# Patient Record
Sex: Male | Born: 1945 | Race: White | Hispanic: No | Smoking: Never smoker
Health system: Southern US, Community
[De-identification: ages and names within clinical notes are randomized; demographics above are authoritative.]

## PROBLEM LIST (undated history)

## (undated) DIAGNOSIS — F410 Panic disorder [episodic paroxysmal anxiety] without agoraphobia: Secondary | ICD-10-CM

## (undated) DIAGNOSIS — F419 Anxiety disorder, unspecified: Secondary | ICD-10-CM

## (undated) DIAGNOSIS — F32A Depression, unspecified: Secondary | ICD-10-CM

## (undated) DIAGNOSIS — J189 Pneumonia, unspecified organism: Secondary | ICD-10-CM

## (undated) HISTORY — PX: ROTATOR CUFF REPAIR: SHX139

---

## 2019-02-03 HISTORY — PX: BRACHIAL PLEXUS EXPLORATION: SHX1261

## 2019-06-17 ENCOUNTER — Other Ambulatory Visit: Payer: Self-pay | Admitting: *Deleted

## 2019-06-17 DIAGNOSIS — Z20822 Contact with and (suspected) exposure to covid-19: Secondary | ICD-10-CM

## 2019-06-19 LAB — NOVEL CORONAVIRUS, NAA: SARS-CoV-2, NAA: NOT DETECTED

## 2019-06-20 ENCOUNTER — Telehealth: Payer: Self-pay | Admitting: General Practice

## 2019-06-20 NOTE — Telephone Encounter (Signed)
Negative COVID results given. Patient results "NOT Detected." Caller expressed understanding. ° °

## 2019-06-25 ENCOUNTER — Telehealth: Payer: Self-pay

## 2019-06-25 NOTE — Telephone Encounter (Signed)
Patient was returning call stating that he has already received his COVID-19 negative result. Patient was reassured that his test is negative 06/20/2019. No further action was needed.

## 2020-07-21 ENCOUNTER — Other Ambulatory Visit: Payer: Self-pay | Admitting: Neurological Surgery

## 2020-08-05 ENCOUNTER — Other Ambulatory Visit: Payer: Self-pay

## 2020-08-05 ENCOUNTER — Encounter (HOSPITAL_COMMUNITY)
Admission: RE | Admit: 2020-08-05 | Discharge: 2020-08-05 | Disposition: A | Discharge status: Home / Self Care | Payer: No Typology Code available for payment source | Source: Ambulatory Visit | Attending: Neurological Surgery | Admitting: Neurological Surgery

## 2020-08-05 ENCOUNTER — Other Ambulatory Visit (HOSPITAL_COMMUNITY)
Admission: RE | Admit: 2020-08-05 | Discharge: 2020-08-05 | Disposition: A | Discharge status: Home / Self Care | Payer: Medicare HMO | Source: Ambulatory Visit | Attending: Neurological Surgery | Admitting: Neurological Surgery

## 2020-08-05 ENCOUNTER — Encounter (HOSPITAL_COMMUNITY): Payer: Self-pay

## 2020-08-05 ENCOUNTER — Ambulatory Visit (HOSPITAL_COMMUNITY)
Admission: RE | Admit: 2020-08-05 | Discharge: 2020-08-05 | Disposition: A | Discharge status: Home / Self Care | Payer: No Typology Code available for payment source | Source: Ambulatory Visit | Attending: Neurological Surgery | Admitting: Neurological Surgery

## 2020-08-05 DIAGNOSIS — M4310 Spondylolisthesis, site unspecified: Secondary | ICD-10-CM | POA: Insufficient documentation

## 2020-08-05 DIAGNOSIS — Z01812 Encounter for preprocedural laboratory examination: Secondary | ICD-10-CM | POA: Diagnosis present

## 2020-08-05 DIAGNOSIS — Z20822 Contact with and (suspected) exposure to covid-19: Secondary | ICD-10-CM | POA: Insufficient documentation

## 2020-08-05 HISTORY — DX: Pneumonia, unspecified organism: J18.9

## 2020-08-05 HISTORY — DX: Anxiety disorder, unspecified: F41.9

## 2020-08-05 HISTORY — DX: Panic disorder (episodic paroxysmal anxiety): F41.0

## 2020-08-05 HISTORY — DX: Depression, unspecified: F32.A

## 2020-08-05 LAB — PROTIME-INR
INR: 1 (ref 0.8–1.2)
Prothrombin Time: 12.9 seconds (ref 11.4–15.2)

## 2020-08-05 LAB — CBC WITH DIFFERENTIAL/PLATELET
Abs Immature Granulocytes: 0.02 10*3/uL (ref 0.00–0.07)
Basophils Absolute: 0 10*3/uL (ref 0.0–0.1)
Basophils Relative: 0 %
Eosinophils Absolute: 0 10*3/uL (ref 0.0–0.5)
Eosinophils Relative: 1 %
HCT: 44.2 % (ref 39.0–52.0)
Hemoglobin: 14.3 g/dL (ref 13.0–17.0)
Immature Granulocytes: 0 %
Lymphocytes Relative: 28 %
Lymphs Abs: 1.7 10*3/uL (ref 0.7–4.0)
MCH: 31.8 pg (ref 26.0–34.0)
MCHC: 32.4 g/dL (ref 30.0–36.0)
MCV: 98.4 fL (ref 80.0–100.0)
Monocytes Absolute: 0.6 10*3/uL (ref 0.1–1.0)
Monocytes Relative: 10 %
Neutro Abs: 3.9 10*3/uL (ref 1.7–7.7)
Neutrophils Relative %: 61 %
Platelets: 192 10*3/uL (ref 150–400)
RBC: 4.49 MIL/uL (ref 4.22–5.81)
RDW: 11.8 % (ref 11.5–15.5)
WBC: 6.3 10*3/uL (ref 4.0–10.5)
nRBC: 0 % (ref 0.0–0.2)

## 2020-08-05 LAB — BASIC METABOLIC PANEL
Anion gap: 9 (ref 5–15)
BUN: 14 mg/dL (ref 8–23)
CO2: 26 mmol/L (ref 22–32)
Calcium: 9.5 mg/dL (ref 8.9–10.3)
Chloride: 104 mmol/L (ref 98–111)
Creatinine, Ser: 0.94 mg/dL (ref 0.61–1.24)
GFR, Estimated: >60 mL/min (ref >60)
Glucose, Bld: 81 mg/dL (ref 70–99)
Potassium: 4.9 mmol/L (ref 3.5–5.1)
Sodium: 139 mmol/L (ref 135–145)

## 2020-08-05 LAB — TYPE AND SCREEN
ABO/RH(D): O POS
Antibody Screen: NEGATIVE

## 2020-08-05 LAB — SARS CORONAVIRUS 2 (TAT 6-24 HRS): SARS Coronavirus 2: NEGATIVE

## 2020-08-05 LAB — SURGICAL PCR SCREEN
MRSA, PCR: NEGATIVE
Staphylococcus aureus: POSITIVE — AB

## 2020-08-05 NOTE — Progress Notes (Signed)
PCP - Dr. Gerlene Fee @ [facility] Cardiologist -  na   Chest x-ray - 08/05/20 EKG - 08/05/20 Stress Test - yrs. Ago-normal ECHO - na Cardiac Cath - na  Sleep Study - yrs. Ago inconclusive   Blood Thinner Instructions: na Aspirin Instructions: stopped 08/02/20  COVID TEST-  08/05/20   Anesthesia review:   Patient denies shortness of breath, fever, cough and chest pain at PAT appointment   All instructions explained to the patient, with a verbal understanding of the material. Patient agrees to go over the instructions while at home for a better understanding. Patient also instructed to self quarantine after being tested for COVID-19. The opportunity to ask questions was provided.

## 2020-08-05 NOTE — Pre-Procedure Instructions (Signed)
Jason Martinez  08/05/2020      [id number] - [place], [place] - [address] AT [facility] [address] [place] [address] Phone: [phone] Fax: [phone]    Your procedure is scheduled on Dec.6  Report to [facility] Entrance A at 9:15 A.M.  Call this number if you have problems the morning of surgery:  [phone]   Remember:  Do not eat or drink after midnight.      Take these medicines the morning of surgery with A SIP OF WATER :              Tylenol if needed             Clonazepam (klonopin)             Loratadine (claritin)             Omeprazole (prilosec)                      7 days prior to surgery STOP taking any Aspirin (unless otherwise instructed by your surgeon), Aleve, Naproxen, Ibuprofen, Motrin, Advil, Goody's, BC's, all herbal medications, fish oil, and all vitamins.              Follow your surgeon's instructions on when to stop Aspirin.  If no instructions were given by your surgeon then you will need to call the office to get those instructions.      Do not wear jewelry.  Do not wear lotions, powders, or perfumes, or deodorant.  Do not shave 48 hours prior to surgery.  Men may shave face and neck.  Do not bring valuables to the hospital.  [facility] is not responsible for any belongings or valuables.  Contacts, dentures or bridgework may not be worn into surgery.  Leave your suitcase in the car.  After surgery it may be brought to your room.  For patients admitted to the hospital, discharge time will be determined by your treatment team.  Patients discharged the day of surgery will not be allowed to drive home.    Special instructions:  [facility]- Preparing For Surgery  Before surgery, you can play an important role. Because skin is not sterile, your skin needs to be as free of germs as possible. You can reduce the number of germs on your skin by washing with CHG (chlorahexidine gluconate) Soap before surgery.  CHG is  an antiseptic cleaner which kills germs and bonds with the skin to continue killing germs even after washing.    Oral Hygiene is also important to reduce your risk of infection.  Remember - BRUSH YOUR TEETH THE MORNING OF SURGERY WITH YOUR REGULAR TOOTHPASTE  Please do not use if you have an allergy to CHG or antibacterial soaps. If your skin becomes reddened/irritated stop using the CHG.  Do not shave (including legs and underarms) for at least 48 hours prior to first CHG shower. It is OK to shave your face.  Please follow these instructions carefully.   1. Shower the NIGHT BEFORE SURGERY and the MORNING OF SURGERY with CHG.   2. If you chose to wash your hair, wash your hair first as usual with your normal shampoo.  3. After you shampoo, rinse your hair and body thoroughly to remove the shampoo.  4. Use CHG as you would any other liquid soap. You can apply CHG directly to the skin and wash gently with a scrungie or a clean washcloth.  5. Apply the CHG Soap to your body ONLY FROM THE NECK DOWN.  Do not use on open wounds or open sores. Avoid contact with your eyes, ears, mouth and genitals (private parts). Wash Face and genitals (private parts)  with your normal soap.  6. Wash thoroughly, paying special attention to the area where your surgery will be performed.  7. Thoroughly rinse your body with warm water from the neck down.  8. DO NOT shower/wash with your normal soap after using and rinsing off the CHG Soap.  9. Pat yourself dry with a CLEAN TOWEL.  10. Wear CLEAN PAJAMAS to bed the night before surgery, wear comfortable clothes the morning of surgery  11. Place CLEAN SHEETS on your bed the night of your first shower and DO NOT SLEEP WITH PETS.    Day of Surgery:  Do not apply any deodorants/lotions.  Please wear clean clothes to the hospital/surgery center.   Remember to brush your teeth WITH YOUR REGULAR TOOTHPASTE.    Please read over the following fact sheets that  you were given.

## 2020-08-09 ENCOUNTER — Encounter (HOSPITAL_COMMUNITY): Admission: RE | Payer: Self-pay | Source: Home / Self Care

## 2020-08-09 ENCOUNTER — Other Ambulatory Visit: Payer: Self-pay | Admitting: Neurological Surgery

## 2020-08-09 ENCOUNTER — Other Ambulatory Visit (HOSPITAL_COMMUNITY): Payer: Self-pay | Admitting: Neurological Surgery

## 2020-08-09 ENCOUNTER — Ambulatory Visit (HOSPITAL_COMMUNITY)
Admission: RE | Admit: 2020-08-09 | Payer: No Typology Code available for payment source | Source: Home / Self Care | Admitting: Neurological Surgery

## 2020-08-09 DIAGNOSIS — M79602 Pain in left arm: Secondary | ICD-10-CM

## 2020-08-09 SURGERY — POSTERIOR LUMBAR FUSION 1 LEVEL
Anesthesia: General | Site: Back

## 2020-08-19 ENCOUNTER — Other Ambulatory Visit: Payer: Self-pay

## 2020-08-19 ENCOUNTER — Ambulatory Visit (HOSPITAL_COMMUNITY)
Admission: RE | Admit: 2020-08-19 | Discharge: 2020-08-19 | Disposition: A | Discharge status: Home / Self Care | Payer: No Typology Code available for payment source | Source: Ambulatory Visit | Attending: Neurological Surgery | Admitting: Neurological Surgery

## 2020-08-19 DIAGNOSIS — M79602 Pain in left arm: Secondary | ICD-10-CM | POA: Insufficient documentation

## 2020-09-05 ENCOUNTER — Other Ambulatory Visit: Payer: Self-pay

## 2020-09-05 ENCOUNTER — Encounter (HOSPITAL_COMMUNITY): Payer: Self-pay | Admitting: Emergency Medicine

## 2020-09-05 ENCOUNTER — Emergency Department (HOSPITAL_COMMUNITY): Payer: No Typology Code available for payment source

## 2020-09-05 ENCOUNTER — Emergency Department (HOSPITAL_COMMUNITY)
Admission: EM | Admit: 2020-09-05 | Discharge: 2020-09-06 | Disposition: A | Discharge status: Home / Self Care | Payer: No Typology Code available for payment source | Attending: Emergency Medicine | Admitting: Emergency Medicine

## 2020-09-05 DIAGNOSIS — R0602 Shortness of breath: Secondary | ICD-10-CM | POA: Diagnosis not present

## 2020-09-05 DIAGNOSIS — R079 Chest pain, unspecified: Secondary | ICD-10-CM | POA: Diagnosis not present

## 2020-09-05 DIAGNOSIS — Z7982 Long term (current) use of aspirin: Secondary | ICD-10-CM | POA: Insufficient documentation

## 2020-09-05 DIAGNOSIS — Z20822 Contact with and (suspected) exposure to covid-19: Secondary | ICD-10-CM | POA: Diagnosis not present

## 2020-09-05 DIAGNOSIS — J9811 Atelectasis: Secondary | ICD-10-CM | POA: Diagnosis not present

## 2020-09-05 DIAGNOSIS — R0789 Other chest pain: Secondary | ICD-10-CM | POA: Insufficient documentation

## 2020-09-05 LAB — CBC
HCT: 44.2 % (ref 39.0–52.0)
Hemoglobin: 14.9 g/dL (ref 13.0–17.0)
MCH: 31.9 pg (ref 26.0–34.0)
MCHC: 33.7 g/dL (ref 30.0–36.0)
MCV: 94.6 fL (ref 80.0–100.0)
Platelets: 175 10*3/uL (ref 150–400)
RBC: 4.67 MIL/uL (ref 4.22–5.81)
RDW: 11.6 % (ref 11.5–15.5)
WBC: 5.9 10*3/uL (ref 4.0–10.5)
nRBC: 0 % (ref 0.0–0.2)

## 2020-09-05 LAB — TROPONIN I (HIGH SENSITIVITY)
Troponin I (High Sensitivity): 7 ng/L (ref <18)
Troponin I (High Sensitivity): 6 ng/L (ref <18)

## 2020-09-05 LAB — BASIC METABOLIC PANEL
Anion gap: 8 (ref 5–15)
BUN: 10 mg/dL (ref 8–23)
CO2: 25 mmol/L (ref 22–32)
Calcium: 9.4 mg/dL (ref 8.9–10.3)
Chloride: 101 mmol/L (ref 98–111)
Creatinine, Ser: 0.84 mg/dL (ref 0.61–1.24)
GFR, Estimated: >60 mL/min (ref >60)
Glucose, Bld: 107 mg/dL — ABNORMAL HIGH (ref 70–99)
Potassium: 3.9 mmol/L (ref 3.5–5.1)
Sodium: 134 mmol/L — ABNORMAL LOW (ref 135–145)

## 2020-09-05 LAB — RESP PANEL BY RT-PCR (FLU A&B, COVID) ARPGX2
Influenza A by PCR: NEGATIVE
Influenza B by PCR: NEGATIVE
SARS Coronavirus 2 by RT PCR: NEGATIVE

## 2020-09-05 MED ORDER — IOHEXOL 350 MG/ML SOLN
100.0000 mL | Freq: Once | INTRAVENOUS | Status: AC | PRN
  Administered 2020-09-06: 100 mL via INTRAVENOUS

## 2020-09-05 NOTE — ED Triage Notes (Signed)
Patient c/o left chest pain that radiates into left arm pain that started this morning. Patient reports shortness of breath, fatigue, dizziness, and diarrhea. Denies any cardiac hx.
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
## 2020-09-06 DIAGNOSIS — J9811 Atelectasis: Secondary | ICD-10-CM | POA: Diagnosis not present

## 2020-09-06 DIAGNOSIS — R079 Chest pain, unspecified: Secondary | ICD-10-CM | POA: Diagnosis not present

## 2020-09-06 DIAGNOSIS — R0602 Shortness of breath: Secondary | ICD-10-CM | POA: Diagnosis not present

## 2020-09-06 NOTE — ED Provider Notes (Signed)
Care assumed from Dr. Rubin Payor and Langston Masker at shift change.  Patient presented here after an episode of chest pain that started earlier this afternoon.  Patient awaiting results of a CTA of the chest and repeat troponin.  Both of the studies have returned and are negative.  Patient has been reassessed.  He has been pain-free for many hours and appears stable.  At this point, I feel as though discharge is appropriate.  Patient is going to follow-up tomorrow with the [facility] hospital.  To return as needed for any problems.     Geoffery Lyons, MD 09/06/20 [phone]

## 2020-09-06 NOTE — Discharge Instructions (Addendum)
Follow-up with your primary doctor as scheduled, and return to the emergency department if you develop worsening pain, high fever, difficulty breathing, or other new and concerning symptoms.

## 2020-10-20 DIAGNOSIS — T8092XA Unspecified transfusion reaction, initial encounter: Secondary | ICD-10-CM | POA: Diagnosis not present

## 2020-12-30 DIAGNOSIS — S143XXA Injury of brachial plexus, initial encounter: Secondary | ICD-10-CM | POA: Diagnosis not present

## 2020-12-30 DIAGNOSIS — S143XXD Injury of brachial plexus, subsequent encounter: Secondary | ICD-10-CM | POA: Diagnosis not present

## 2021-01-19 ENCOUNTER — Emergency Department (HOSPITAL_COMMUNITY)
Admission: EM | Admit: 2021-01-19 | Discharge: 2021-01-19 | Disposition: A | Discharge status: Home / Self Care | Payer: No Typology Code available for payment source | Attending: Emergency Medicine | Admitting: Emergency Medicine

## 2021-01-19 ENCOUNTER — Encounter (HOSPITAL_COMMUNITY): Payer: Self-pay

## 2021-01-19 ENCOUNTER — Other Ambulatory Visit: Payer: Self-pay

## 2021-01-19 DIAGNOSIS — R109 Unspecified abdominal pain: Secondary | ICD-10-CM | POA: Diagnosis not present

## 2021-01-19 DIAGNOSIS — Z5321 Procedure and treatment not carried out due to patient leaving prior to being seen by health care provider: Secondary | ICD-10-CM | POA: Insufficient documentation

## 2021-01-19 DIAGNOSIS — R1084 Generalized abdominal pain: Secondary | ICD-10-CM | POA: Diagnosis not present

## 2021-01-19 DIAGNOSIS — R110 Nausea: Secondary | ICD-10-CM | POA: Insufficient documentation

## 2021-01-19 DIAGNOSIS — R1013 Epigastric pain: Secondary | ICD-10-CM

## 2021-01-19 LAB — CBC
HCT: 46.6 % (ref 39.0–52.0)
Hemoglobin: 15.2 g/dL (ref 13.0–17.0)
MCH: 30.8 pg (ref 26.0–34.0)
MCHC: 32.6 g/dL (ref 30.0–36.0)
MCV: 94.3 fL (ref 80.0–100.0)
Platelets: 172 10*3/uL (ref 150–400)
RBC: 4.94 MIL/uL (ref 4.22–5.81)
RDW: 13.2 % (ref 11.5–15.5)
WBC: 9.5 10*3/uL (ref 4.0–10.5)
nRBC: 0 % (ref 0.0–0.2)

## 2021-01-19 LAB — COMPREHENSIVE METABOLIC PANEL
ALT: 29 U/L (ref 0–44)
AST: 35 U/L (ref 15–41)
Albumin: 4.4 g/dL (ref 3.5–5.0)
Alkaline Phosphatase: 62 U/L (ref 38–126)
Anion gap: 11 (ref 5–15)
BUN: 16 mg/dL (ref 8–23)
CO2: 27 mmol/L (ref 22–32)
Calcium: 9.3 mg/dL (ref 8.9–10.3)
Chloride: 96 mmol/L — ABNORMAL LOW (ref 98–111)
Creatinine, Ser: 0.82 mg/dL (ref 0.61–1.24)
GFR, Estimated: >60 mL/min (ref >60)
Glucose, Bld: 132 mg/dL — ABNORMAL HIGH (ref 70–99)
Potassium: 4.2 mmol/L (ref 3.5–5.1)
Sodium: 134 mmol/L — ABNORMAL LOW (ref 135–145)
Total Bilirubin: 0.8 mg/dL (ref 0.3–1.2)
Total Protein: 7.1 g/dL (ref 6.5–8.1)

## 2021-01-19 LAB — LIPASE, BLOOD: Lipase: 23 U/L (ref 11–51)

## 2021-01-19 NOTE — ED Triage Notes (Signed)
Pt arrived via ems with c/o abd pain in center of abd and nausea x 3 hours.  LBM was 1 1/2 hours ago.  Denies diarrhea, denies any urinary symptoms.  Pt reports drinks a few beers daily.  Last etoh intake was yesterday.

## 2021-01-19 NOTE — ED Provider Notes (Signed)
Emergency Medicine Provider Triage Evaluation Note  Jason Martinez , a 75 y.o. male  was evaluated in triage.  Pt complains of abdominal pain.  Review of Systems  Positive: Nausea Negative: No vomiting, no diarrhea, no difficulty walking  Physical Exam  BP 125/84 (BP Location: Right Arm)   Pulse 68   Temp 98.3 F (36.8 C) (Oral)   Resp 18   Ht 5\' 9"  (1.753 m)   Wt 79.4 kg   SpO2 100%   BMI 25.84 kg/m  Gen:   Awake, no distress   Resp:  Normal effort  MSK:   Moves extremities without difficulty  Other:  Mild epigastric tenderness  Medical Decision Making  Medically screening exam initiated at 3:08 PM.  Appropriate orders placed.  Jason Martinez was informed that the remainder of the evaluation will be completed by another provider, this initial triage assessment does not replace that evaluation, and the importance of remaining in the ED until their evaluation is complete.  Laboratory testing ordered to evaluate for pancreatitis and metabolic disorders.  Vital signs are reassuring.  No acute interventions required from the screening process.   Jason Jhair, MD 01/21/21 1020

## 2021-01-29 ENCOUNTER — Other Ambulatory Visit: Payer: Self-pay

## 2021-01-29 ENCOUNTER — Encounter: Payer: Self-pay | Admitting: Emergency Medicine

## 2021-01-29 ENCOUNTER — Ambulatory Visit
Admission: EM | Admit: 2021-01-29 | Discharge: 2021-01-29 | Disposition: A | Discharge status: Home / Self Care | Payer: No Typology Code available for payment source | Attending: Family Medicine | Admitting: Family Medicine

## 2021-01-29 DIAGNOSIS — R1013 Epigastric pain: Secondary | ICD-10-CM

## 2021-01-29 DIAGNOSIS — K5900 Constipation, unspecified: Secondary | ICD-10-CM

## 2021-01-29 DIAGNOSIS — K219 Gastro-esophageal reflux disease without esophagitis: Secondary | ICD-10-CM

## 2021-01-29 DIAGNOSIS — K921 Melena: Secondary | ICD-10-CM | POA: Diagnosis not present
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
## 2021-01-29 NOTE — Discharge Instructions (Addendum)
May take 1 capful of MiraLAX with warm liquid daily as needed for constipation  May take simethicone with meals and before bedtime  May try following a low inflammation diet for the next 2 weeks  We have drawn some labs, I will call you in the morning if there is anything abnormal that needs further treatment  Continue omeprazole twice a day  Follow up with this office or with primary care if symptoms are persisting.  Follow up in the ER for high fever, trouble swallowing, trouble breathing, other concerning symptoms.

## 2021-01-29 NOTE — ED Triage Notes (Signed)
Mid abd pain, was seen in the ED recently for same.  Burping and gassy.  Had a black bowel movement today.  States he has no energy.  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

## 2021-01-30 LAB — COMPREHENSIVE METABOLIC PANEL
ALT: 23 IU/L (ref 0–44)
AST: 25 IU/L (ref 0–40)
Albumin/Globulin Ratio: 2 (ref 1.2–2.2)
Albumin: 4.7 g/dL (ref 3.7–4.7)
Alkaline Phosphatase: 72 IU/L (ref 44–121)
BUN/Creatinine Ratio: 8 — ABNORMAL LOW (ref 10–24)
BUN: 7 mg/dL — ABNORMAL LOW (ref 8–27)
Bilirubin Total: 0.5 mg/dL (ref 0.0–1.2)
CO2: 21 mmol/L (ref 20–29)
Calcium: 9.3 mg/dL (ref 8.6–10.2)
Chloride: 97 mmol/L (ref 96–106)
Creatinine, Ser: 0.93 mg/dL (ref 0.76–1.27)
Globulin, Total: 2.4 g/dL (ref 1.5–4.5)
Glucose: 95 mg/dL (ref 65–99)
Potassium: 4.7 mmol/L (ref 3.5–5.2)
Sodium: 135 mmol/L (ref 134–144)
Total Protein: 7.1 g/dL (ref 6.0–8.5)
eGFR: 87 mL/min/{1.73_m2} (ref >59)

## 2021-01-30 LAB — CBC WITH DIFFERENTIAL/PLATELET
Basophils Absolute: 0 10*3/uL (ref 0.0–0.2)
Basos: 1 %
EOS (ABSOLUTE): 0 10*3/uL (ref 0.0–0.4)
Eos: 0 %
Hematocrit: 46.3 % (ref 37.5–51.0)
Hemoglobin: 15.4 g/dL (ref 13.0–17.7)
Immature Grans (Abs): 0 10*3/uL (ref 0.0–0.1)
Immature Granulocytes: 0 %
Lymphocytes Absolute: 1.8 10*3/uL (ref 0.7–3.1)
Lymphs: 30 %
MCH: 31 pg (ref 26.6–33.0)
MCHC: 33.3 g/dL (ref 31.5–35.7)
MCV: 93 fL (ref 79–97)
Monocytes Absolute: 0.5 10*3/uL (ref 0.1–0.9)
Monocytes: 9 %
Neutrophils Absolute: 3.7 10*3/uL (ref 1.4–7.0)
Neutrophils: 60 %
Platelets: 178 10*3/uL (ref 150–450)
RBC: 4.97 x10E6/uL (ref 4.14–5.80)
RDW: 14 % (ref 11.6–15.4)
WBC: 6.1 10*3/uL (ref 3.4–10.8)

## 2021-01-30 LAB — LIPASE: Lipase: 18 U/L (ref 13–78)

## 2021-03-31 DIAGNOSIS — S143XXD Injury of brachial plexus, subsequent encounter: Secondary | ICD-10-CM | POA: Diagnosis not present

## 2021-08-11 DIAGNOSIS — R944 Abnormal results of kidney function studies: Secondary | ICD-10-CM | POA: Diagnosis not present

## 2021-08-11 DIAGNOSIS — G4700 Insomnia, unspecified: Secondary | ICD-10-CM | POA: Diagnosis not present

## 2021-09-08 ENCOUNTER — Other Ambulatory Visit: Payer: Self-pay | Admitting: Physician Assistant

## 2021-09-08 ENCOUNTER — Other Ambulatory Visit (HOSPITAL_COMMUNITY): Payer: Self-pay | Admitting: Physician Assistant

## 2021-09-08 DIAGNOSIS — M542 Cervicalgia: Secondary | ICD-10-CM

## 2021-09-21 ENCOUNTER — Ambulatory Visit (HOSPITAL_COMMUNITY)
Admission: RE | Admit: 2021-09-21 | Discharge: 2021-09-21 | Disposition: A | Discharge status: Home / Self Care | Payer: No Typology Code available for payment source | Source: Ambulatory Visit | Attending: Physician Assistant | Admitting: Physician Assistant

## 2021-09-21 ENCOUNTER — Other Ambulatory Visit: Payer: Self-pay

## 2021-09-21 DIAGNOSIS — M542 Cervicalgia: Secondary | ICD-10-CM | POA: Insufficient documentation

## 2021-10-05 ENCOUNTER — Encounter: Payer: Self-pay | Admitting: Physical Medicine and Rehabilitation

## 2021-10-05 DEATH — deceased

## 2021-12-21 ENCOUNTER — Other Ambulatory Visit (HOSPITAL_COMMUNITY): Payer: Self-pay | Admitting: Physician Assistant

## 2021-12-21 ENCOUNTER — Other Ambulatory Visit: Payer: Self-pay | Admitting: Physician Assistant

## 2021-12-21 DIAGNOSIS — G8929 Other chronic pain: Secondary | ICD-10-CM

## 2021-12-21 DIAGNOSIS — M25512 Pain in left shoulder: Secondary | ICD-10-CM

## 2021-12-27 ENCOUNTER — Ambulatory Visit: Admission: EM | Admit: 2021-12-27 | Discharge: 2021-12-27 | Discharge status: Absent without leave | Payer: Medicare PPO

## 2021-12-28 ENCOUNTER — Ambulatory Visit
Admission: EM | Admit: 2021-12-28 | Discharge: 2021-12-28 | Disposition: A | Discharge status: Home / Self Care | Payer: No Typology Code available for payment source | Attending: Family Medicine | Admitting: Family Medicine

## 2021-12-28 ENCOUNTER — Ambulatory Visit (INDEPENDENT_AMBULATORY_CARE_PROVIDER_SITE_OTHER): Payer: Medicare PPO

## 2021-12-28 VITALS — BP 111/75 | HR 82 | Temp 98.2°F | Resp 18

## 2021-12-28 DIAGNOSIS — S42215A Unspecified nondisplaced fracture of surgical neck of left humerus, initial encounter for closed fracture: Secondary | ICD-10-CM

## 2021-12-28 DIAGNOSIS — S42295A Other nondisplaced fracture of upper end of left humerus, initial encounter for closed fracture: Secondary | ICD-10-CM

## 2021-12-28 DIAGNOSIS — W19XXXA Unspecified fall, initial encounter: Secondary | ICD-10-CM

## 2021-12-28 DIAGNOSIS — M25522 Pain in left elbow: Secondary | ICD-10-CM
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
## 2022-01-03 ENCOUNTER — Ambulatory Visit (INDEPENDENT_AMBULATORY_CARE_PROVIDER_SITE_OTHER): Payer: Medicare PPO | Admitting: Orthopedic Surgery

## 2022-01-03 ENCOUNTER — Encounter: Payer: Self-pay | Admitting: Orthopedic Surgery

## 2022-01-03 ENCOUNTER — Ambulatory Visit (INDEPENDENT_AMBULATORY_CARE_PROVIDER_SITE_OTHER): Payer: Medicare PPO

## 2022-01-03 VITALS — Ht 69.0 in | Wt 175.0 lb

## 2022-01-03 DIAGNOSIS — S42295A Other nondisplaced fracture of upper end of left humerus, initial encounter for closed fracture: Secondary | ICD-10-CM | POA: Diagnosis not present

## 2022-01-03 DIAGNOSIS — M25512 Pain in left shoulder: Secondary | ICD-10-CM

## 2022-01-03 MED ORDER — TRAMADOL HCL 50 MG PO TABS: 50.0000 mg | ORAL_TABLET | Freq: Four times a day (QID) | ORAL | 0 refills | Status: AC | PRN

## 2022-01-04 ENCOUNTER — Encounter: Payer: Self-pay | Admitting: Orthopedic Surgery
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
## 2022-01-09 ENCOUNTER — Encounter
Payer: No Typology Code available for payment source | Attending: Physical Medicine and Rehabilitation | Admitting: Physical Medicine and Rehabilitation

## 2022-01-10 ENCOUNTER — Ambulatory Visit (INDEPENDENT_AMBULATORY_CARE_PROVIDER_SITE_OTHER): Payer: Medicare PPO

## 2022-01-10 ENCOUNTER — Ambulatory Visit (INDEPENDENT_AMBULATORY_CARE_PROVIDER_SITE_OTHER): Payer: Medicare PPO | Admitting: Orthopedic Surgery

## 2022-01-10 ENCOUNTER — Encounter: Payer: Self-pay | Admitting: Orthopedic Surgery

## 2022-01-10 DIAGNOSIS — S42295D Other nondisplaced fracture of upper end of left humerus, subsequent encounter for fracture with routine healing: Secondary | ICD-10-CM

## 2022-01-10 DIAGNOSIS — S42295A Other nondisplaced fracture of upper end of left humerus, initial encounter for closed fracture: Secondary | ICD-10-CM
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
## 2022-01-24 ENCOUNTER — Encounter: Payer: Self-pay | Admitting: Orthopedic Surgery

## 2022-01-24 ENCOUNTER — Ambulatory Visit (INDEPENDENT_AMBULATORY_CARE_PROVIDER_SITE_OTHER): Payer: Medicare PPO | Admitting: Orthopedic Surgery

## 2022-01-24 ENCOUNTER — Ambulatory Visit (INDEPENDENT_AMBULATORY_CARE_PROVIDER_SITE_OTHER): Payer: Medicare PPO

## 2022-01-24 DIAGNOSIS — S42295D Other nondisplaced fracture of upper end of left humerus, subsequent encounter for fracture with routine healing: Secondary | ICD-10-CM

## 2022-01-24 NOTE — Progress Notes (Signed)
Return Patient Visit  Assessment: Jason Martinez is a 76 y.o. RHD male with the following: 1. Other closed nondisplaced fracture of proximal end of left humerus  Plan: [name] sustained a minimally displaced fracture of the left proximal humerus.  Radiographs are stable.  He is healing his fracture.  Medications as needed.  Okay to initiate gentle range of motion.  We will start pendulum exercises for the next 2 weeks, as well as range of motion of the elbow wrist and hand.  In 2 weeks, he can initiate passive range of motion.  Follow-up in a month.  Follow-up: Return in about 4 weeks (around 02/21/2022).  Subjective:  Chief Complaint  Patient presents with   Shoulder Pain    LT shoulder DOI 12/21/21---closed nondisplaced fracture of proximal end of left humerus ---pain is much improved    History of Present Illness: Jason Martinez is a 76 y.o. male who returns for evaluation of a left shoulder injury.  He sustained a left proximal humerus fracture 4-5 weeks ago.  His pain is improving.  He notes more function of the left upper extremity.  He is no longer using the sling.   Review of Systems: No fevers or chills No numbness or tingling No chest pain No shortness of breath No bowel or bladder dysfunction No GI distress No headaches  Objective: There were no vitals taken for this visit.  Physical Exam:  General: Alert and oriented. and No acute distress. Gait: Normal gait.  Minimal bruising is appreciated.  Swelling is improved.  Sensation over the axillary nerve distribution is at baseline.  He does have some atrophy of the first webspace.  Fingers are warm and well-perfused.   IMAGING: I personally ordered and reviewed the following images  X-ray of the left shoulder was obtained in clinic today.  These were compared to prior x-rays.  There is been no interval displacement of the fracture.  Glenohumeral joint remains reduced.  There is some evidence of callus  formation.  Impression: Healing left proximal humerus fracture, in unchanged alignment.   New Medications:  No orders of the defined types were placed in this encounter.     Oliver Barre, MD  01/24/2022 10:00 PM

## 2022-01-24 NOTE — Patient Instructions (Signed)
Pendulum   Stand near a wall or a surface that you can hold onto for balance. Bend at the waist and let your left / right arm hang straight down. Use your other arm to support you. Keep your back straight and do not lock your knees. Relax your left / right arm and shoulder muscles, and move your hips and your trunk so your left / right arm swings freely. Your arm should swing because of the motion of your body, not because you are using your arm or shoulder muscles. Keep moving your hips and trunk so your arm swings in the following directions, as told by your health care provider: Side to side. Forward and backward. In clockwise and counterclockwise circles. Continue each motion for 20 seconds, or for as long as told by your health care provider. Slowly return to the starting position.  Repeat 10 times. Complete this exercise daily.     IN 2 weeks, can start to work on passive range of motion.  You can start to stretch the shoulder using the right hand.   Follow up in 1 month

## 2022-02-21 ENCOUNTER — Ambulatory Visit (INDEPENDENT_AMBULATORY_CARE_PROVIDER_SITE_OTHER): Payer: Medicare PPO

## 2022-02-21 ENCOUNTER — Ambulatory Visit (INDEPENDENT_AMBULATORY_CARE_PROVIDER_SITE_OTHER): Payer: Medicare PPO | Admitting: Orthopedic Surgery

## 2022-02-21 ENCOUNTER — Encounter: Payer: Self-pay | Admitting: Orthopedic Surgery

## 2022-02-21 DIAGNOSIS — F32A Depression, unspecified: Secondary | ICD-10-CM | POA: Insufficient documentation

## 2022-02-21 DIAGNOSIS — S42295D Other nondisplaced fracture of upper end of left humerus, subsequent encounter for fracture with routine healing: Secondary | ICD-10-CM

## 2022-02-22 NOTE — Progress Notes (Signed)
Return Patient Visit  Assessment: Jason Martinez is a 76 y.o. RHD male with the following: 1. Other closed nondisplaced fracture of proximal end of left humerus  Plan: Elvis Laufer is a left proximal humerus fracture, with stable alignment.  He has been out of his sling.  He can start to work more aggressively on range of motion.  Start passive range of motion.  As his motion improves, okay to increase his active motion.  He would prefer to do this at home, and not try formal physical therapy at this point.  If he continues to struggle, we can try formal physical therapy.  He is in agreement with this plan.  Follow-up in 1 month.  Follow-up: Return in about 4 weeks (around 03/21/2022).  Subjective:  Chief Complaint  Patient presents with   Arm Injury    Left humerus fracture     History of Present Illness: [name] is a 76 y.o. male who returns for evaluation of a left shoulder injury.  He sustained a left proximal humerus fracture approximately 2 months ago.  His pain is much better.  He has been limiting the use of the left arm.  He has been doing some gentle range of motion.  No numbness or tingling.  He is not using the sling.   Review of Systems: No fevers or chills No numbness or tingling No chest pain No shortness of breath No bowel or bladder dysfunction No GI distress No headaches  Objective: There were no vitals taken for this visit.  Physical Exam:  General: Alert and oriented. and No acute distress. Gait: Normal gait.  Left arm without swelling.  No bruising.  Tolerates passive forward flexion beyond 90 degrees.  Limited external rotation.  Fingers warm and well.  Sensation is intact throughout the left hand.   IMAGING: I personally ordered and reviewed the following images  X-ray of the left shoulder was obtained in clinic today.  These were compared to prior x-rays.  There has been no interval displacement of the proximal humerus fracture.  The  glenohumeral joint remains reduced.  No acute injuries are noted.  The minimal interval callus formation, but there has been some consolidation.  Impression: Healing left proximal humerus fracture, in unchanged alignment.   New Medications:  No orders of the defined types were placed in this encounter.     Oliver Barre, MD  02/22/2022 8:10 AM

## 2022-03-06 ENCOUNTER — Ambulatory Visit (INDEPENDENT_AMBULATORY_CARE_PROVIDER_SITE_OTHER): Payer: Medicare PPO

## 2022-03-06 ENCOUNTER — Ambulatory Visit
Admission: EM | Admit: 2022-03-06 | Discharge: 2022-03-06 | Disposition: A | Discharge status: Home / Self Care | Payer: No Typology Code available for payment source | Attending: Nurse Practitioner | Admitting: Nurse Practitioner

## 2022-03-06 DIAGNOSIS — R5383 Other fatigue: Secondary | ICD-10-CM | POA: Diagnosis not present

## 2022-03-06 DIAGNOSIS — R071 Chest pain on breathing: Secondary | ICD-10-CM

## 2022-03-06 NOTE — Discharge Instructions (Signed)
-   The chest x-ray did not show any acute infectious process; it did show low lung volumes.  Please try to work on deep breathing exercises. -We have checked the blood counts and thyroid level and will let you know if either these come back abnormal -Follow-up with your primary care provider if the blood work comes back normal for further evaluation of your symptoms
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
## 2022-03-06 NOTE — ED Triage Notes (Signed)
Pt presents with c/o fatigue and feeling unwell  for past few months , last had lab work done at the [place] months ago

## 2022-03-07 LAB — CBC WITH DIFFERENTIAL/PLATELET
Basophils Absolute: 0 10*3/uL (ref 0.0–0.2)
Basos: 0 %
EOS (ABSOLUTE): 0 10*3/uL (ref 0.0–0.4)
Eos: 1 %
Hematocrit: 42.7 % (ref 37.5–51.0)
Hemoglobin: 14.7 g/dL (ref 13.0–17.7)
Immature Grans (Abs): 0 10*3/uL (ref 0.0–0.1)
Immature Granulocytes: 0 %
Lymphocytes Absolute: 1.9 10*3/uL (ref 0.7–3.1)
Lymphs: 32 %
MCH: 32 pg (ref 26.6–33.0)
MCHC: 34.4 g/dL (ref 31.5–35.7)
MCV: 93 fL (ref 79–97)
Monocytes Absolute: 0.6 10*3/uL (ref 0.1–0.9)
Monocytes: 10 %
Neutrophils Absolute: 3.4 10*3/uL (ref 1.4–7.0)
Neutrophils: 57 %
Platelets: 195 10*3/uL (ref 150–450)
RBC: 4.6 x10E6/uL (ref 4.14–5.80)
RDW: 12 % (ref 11.6–15.4)
WBC: 5.9 10*3/uL (ref 3.4–10.8)

## 2022-03-07 LAB — TSH: TSH: 1.15 u[IU]/mL (ref 0.450–4.500)

## 2022-03-24 ENCOUNTER — Encounter: Payer: Self-pay | Admitting: Orthopedic Surgery

## 2022-03-24 ENCOUNTER — Ambulatory Visit (INDEPENDENT_AMBULATORY_CARE_PROVIDER_SITE_OTHER): Payer: Medicare PPO | Admitting: Orthopedic Surgery

## 2022-03-24 DIAGNOSIS — S42295D Other nondisplaced fracture of upper end of left humerus, subsequent encounter for fracture with routine healing: Secondary | ICD-10-CM

## 2022-03-24 NOTE — Progress Notes (Signed)
Return Patient Visit  Assessment: Jason Martinez is a 76 y.o. RHD male with the following: 1. Other closed nondisplaced fracture of proximal end of left humerus  Plan: [name] has recovered following a left proximal humerus fracture, which was sustained approximately 3 months ago.  His current function is at his approximate baseline although he does continue to have some pain in the shoulder.  Urged him to continue working on strengthening to improve his overall function.  Medications as needed.  No follow-up is needed at this time.  Follow-up: Return if symptoms worsen or fail to improve.  Subjective:  Chief Complaint  Patient presents with   Post-op Follow-up    Left humerus fracture DOI 12/21/21    History of Present Illness: Jason Martinez is a 75 y.o. male who returns for evaluation of a left shoulder injury.  He sustained a left proximal humerus fracture approximately 3 months ago.  He does continue to have some pain in the left shoulder.  His current level of function is at his approximate baseline.  He has been doing some exercises for his shoulder, with gradual improvement.   Review of Systems: No fevers or chills No numbness or tingling No chest pain No shortness of breath No bowel or bladder dysfunction No GI distress No headaches  Objective: There were no vitals taken for this visit.  Physical Exam:  General: Alert and oriented. and No acute distress. Gait: Normal gait.  Left arm without swelling.  No bruising.  Tolerates passive forward flexion beyond 90 degrees.  Limited external rotation.  Fingers warm and well.  Sensation is intact throughout the left hand.   IMAGING: I personally ordered and reviewed the following images  No new imaging obtained today.   New Medications:  No orders of the defined types were placed in this encounter.     Oliver Barre, MD  03/24/2022 10:31 PM

## 2022-04-28 ENCOUNTER — Ambulatory Visit: Payer: Medicare PPO | Admitting: Physical Medicine and Rehabilitation

## 2022-05-19 IMAGING — CT CT CERVICAL SPINE W/O CM
3 of 4 series · 11 of 33 positions shown, 12 images · non-contrast
Comparison: None.

CLINICAL DATA: Left arm pain with numbness and tingling

EXAM:
CT CERVICAL SPINE WITHOUT CONTRAST
TECHNIQUE: Multidetector CT imaging of the cervical spine was performed without
intravenous contrast. Multiplanar CT image reconstructions were also
generated.

[Series 4: c spine soft · axial · 0.31mm/px · z∈[+1522,+1644]mm · 3 of 101 slices shown, 4 images]
[im 24/101  soft-tissue]
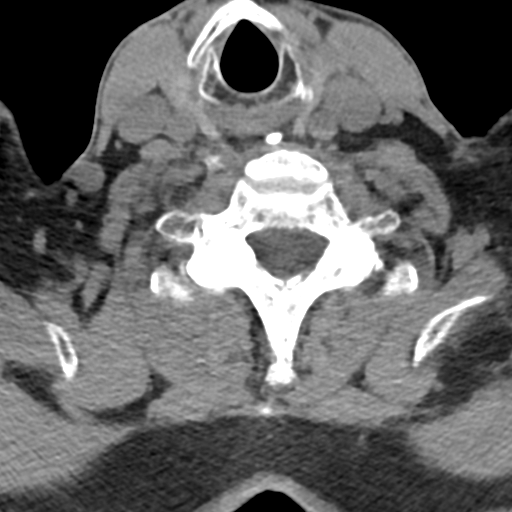
[im 24/101  bone]
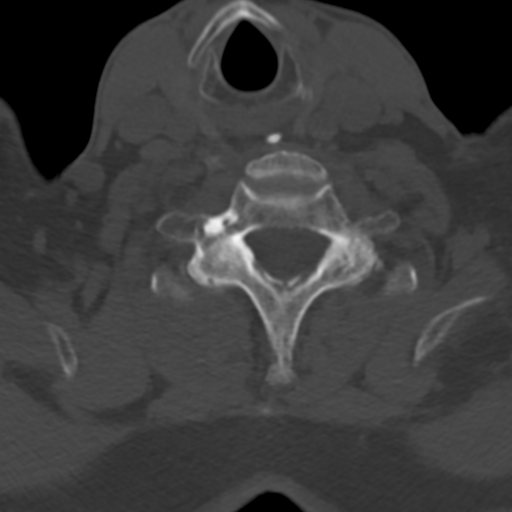
[im 54/101  bone]
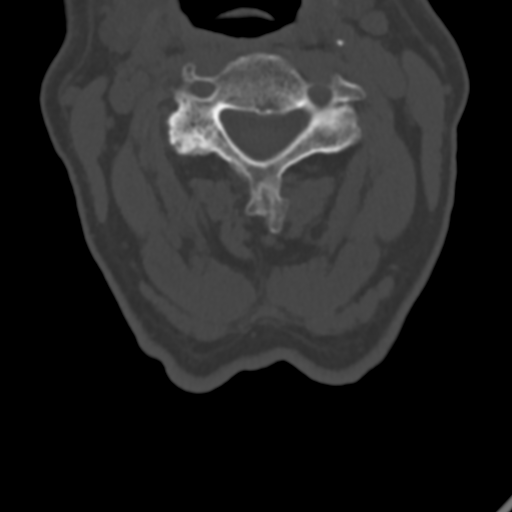
[im 85/101  bone]
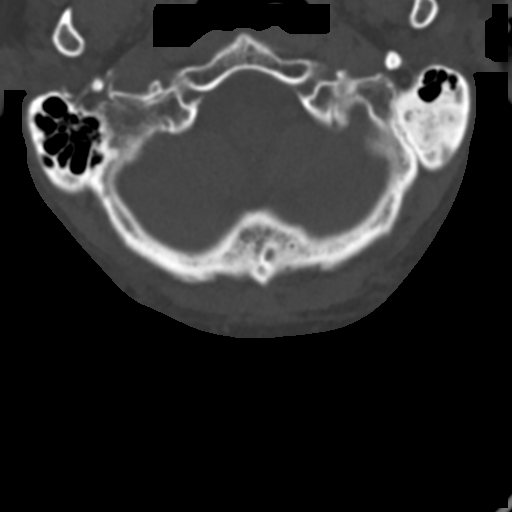

[Series 7: sagittal bone · sagittal · 0.29mm/px · 5 of 61 slices shown]
[im 11/61  bone]
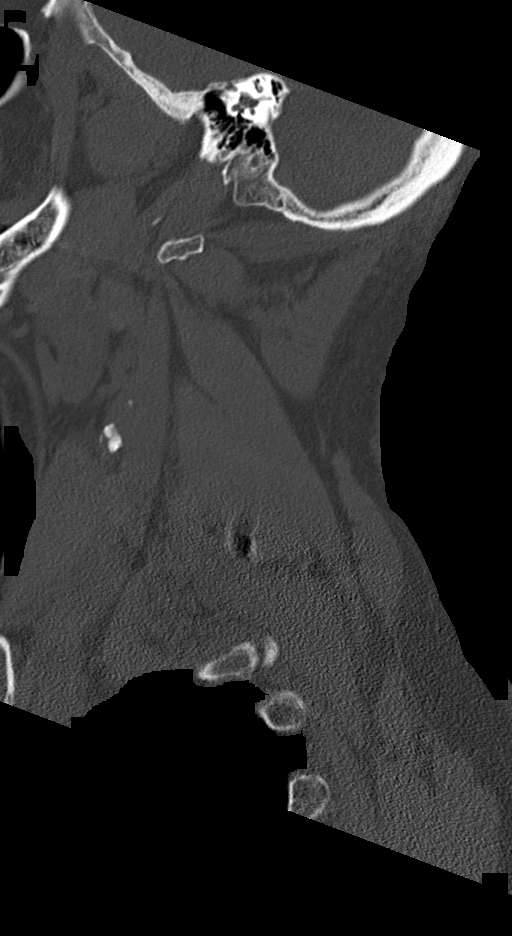
[im 21/61  bone]
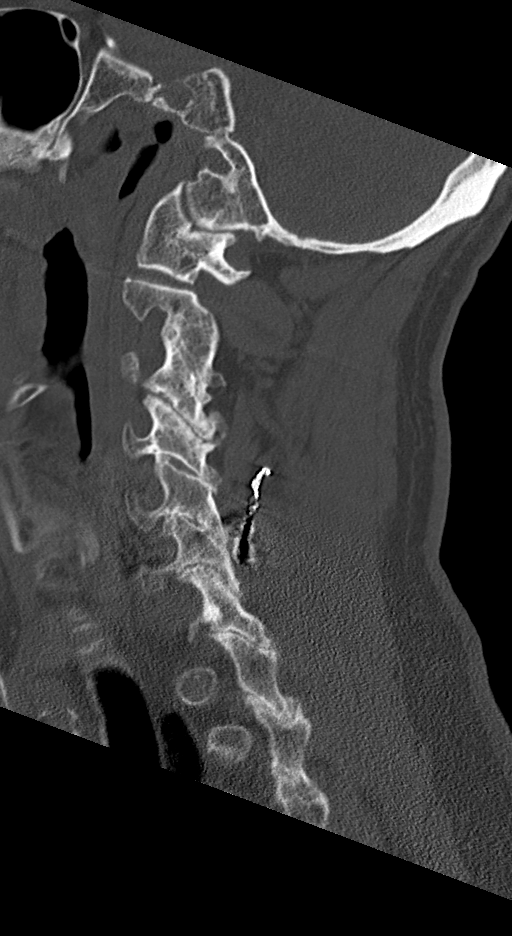
[im 31/61  bone]
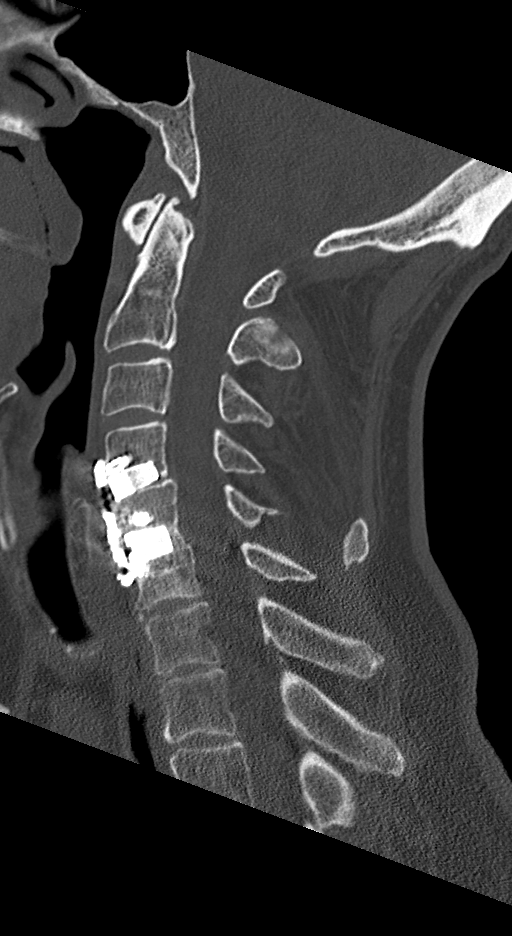
[im 41/61  bone]
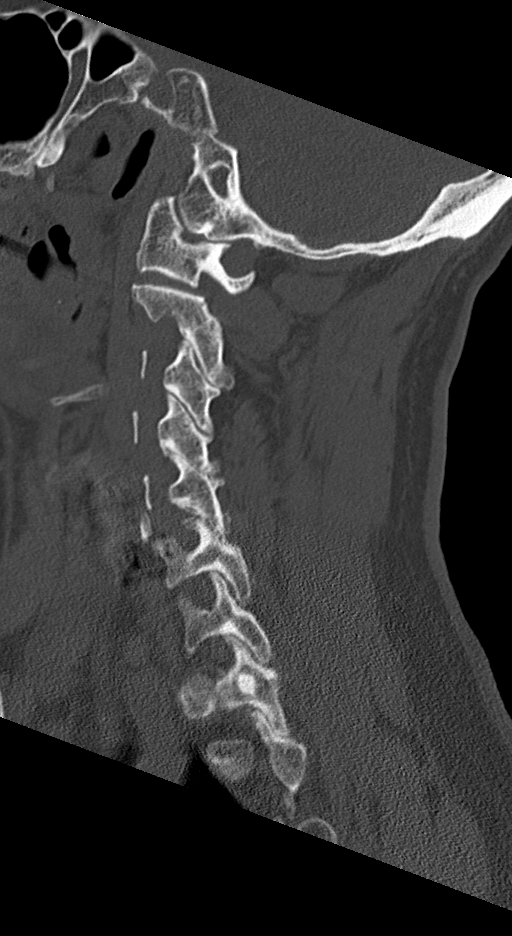
[im 51/61  bone]
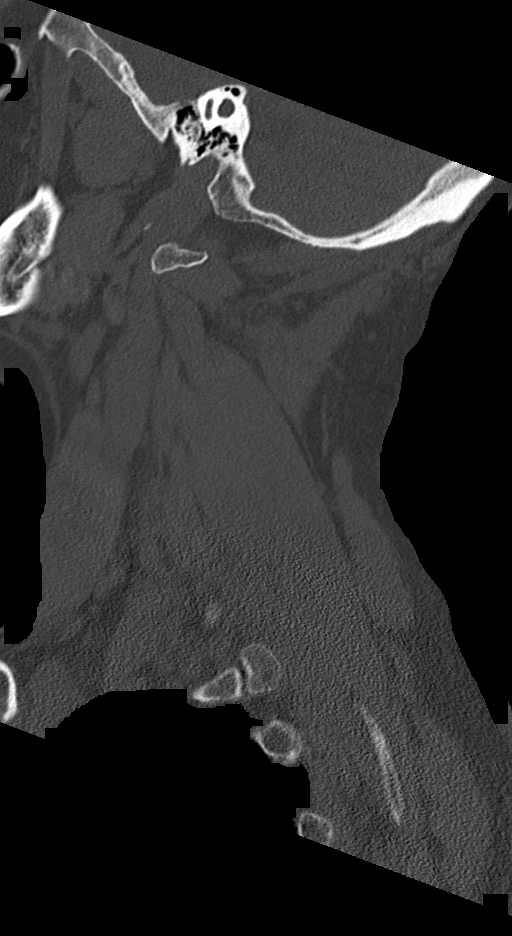

[Series 8: coronal bone · coronal · 0.23mm/px · 3 of 76 slices shown]
[im 17/76  bone]
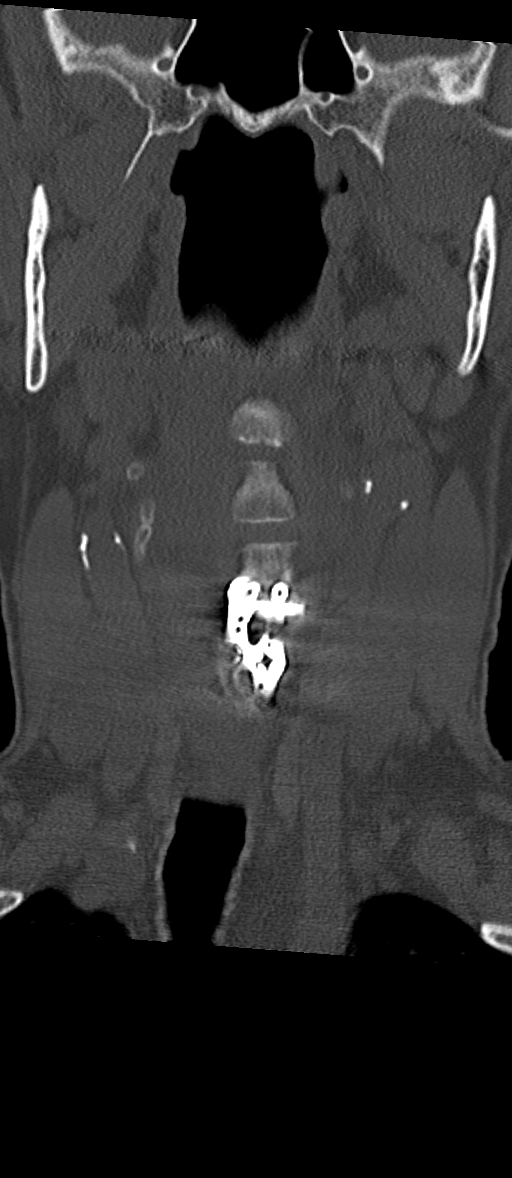
[im 31/76  bone]
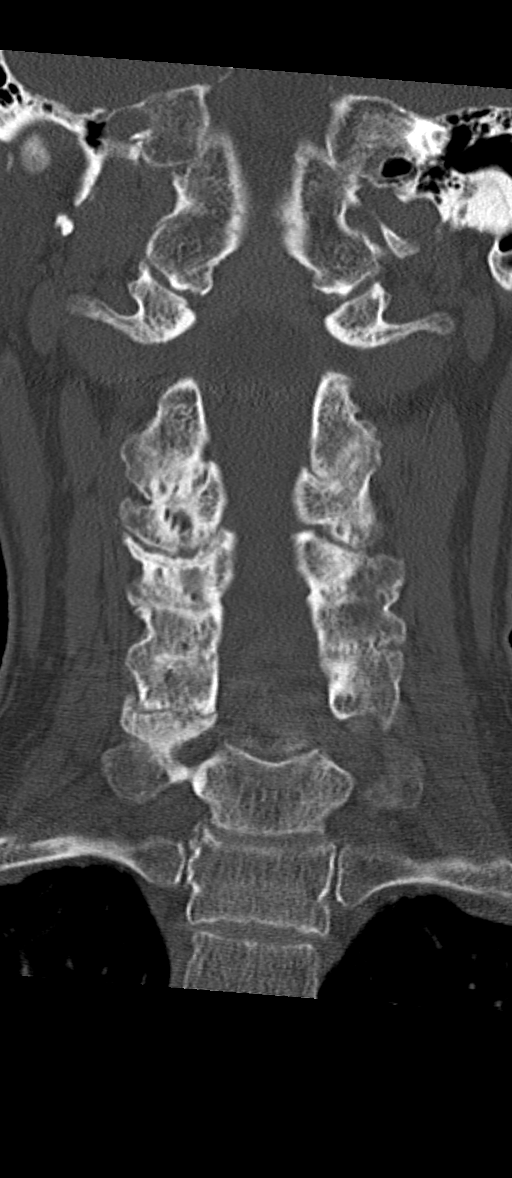
[im 45/76  bone]
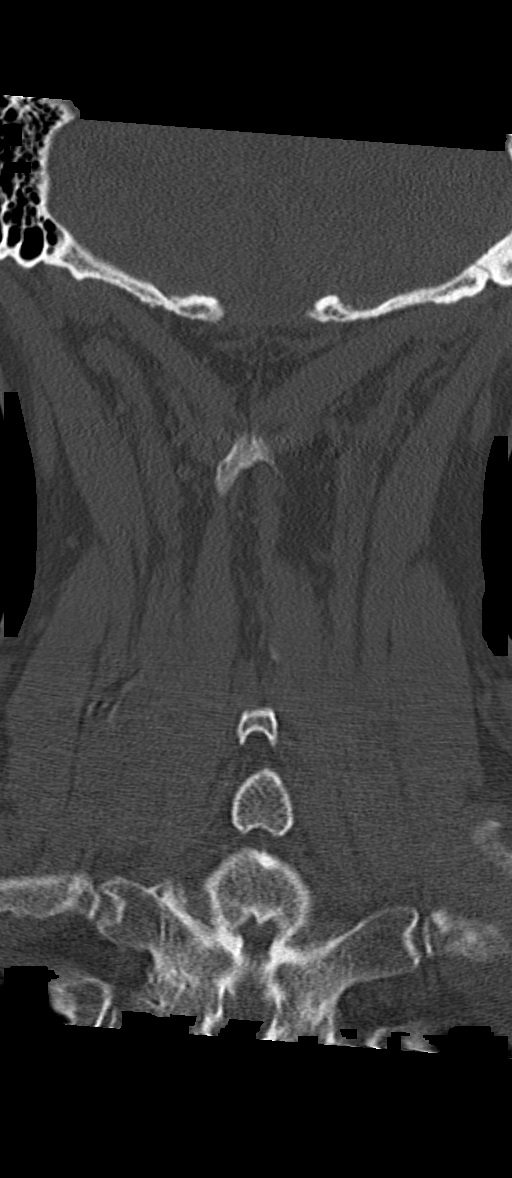

[11 of 33 positions shown; findings below may reference images not displayed]

FINDINGS: Alignment: Grade 1 anterolisthesis at C5-6.  Otherwise normal.

Skull base and vertebrae: No acute fracture. No primary bone lesion
or focal pathologic process.

Soft tissues and spinal canal: No prevertebral fluid or swelling. No
visible canal hematoma.

Disc levels:

C1-2: Normal.

C2-3: Mild uncovertebral spurring. No spinal canal or neural
foraminal stenosis.

C3-4: Severe right facet hypertrophy with moderate right foraminal
stenosis.

C4-5: ACDF without spinal canal or neural foraminal stenosis.

C5-6: ACDF without spinal or neural foraminal stenosis.

C6-7: No spinal canal stenosis or neural impingement.

C7-T1: Normal.

Upper chest: Negative

Other: None
IMPRESSION: 1. Moderate right foraminal stenosis at C3-4 due to severe right
facet hypertrophy.
2. ACDF at C4-5 and C5-6 without spinal canal or neural foraminal
stenosis.
3. No acute fracture or static subluxation of the cervical spine.

## 2022-11-02 ENCOUNTER — Encounter: Payer: Self-pay | Admitting: Radiology

## 2023-02-16 ENCOUNTER — Ambulatory Visit (HOSPITAL_COMMUNITY): Payer: Medicare PPO | Attending: Physician Assistant

## 2023-02-16 DIAGNOSIS — G459 Transient cerebral ischemic attack, unspecified: Secondary | ICD-10-CM | POA: Insufficient documentation

## 2023-02-16 DIAGNOSIS — M545 Low back pain, unspecified: Secondary | ICD-10-CM | POA: Diagnosis not present

## 2023-02-16 DIAGNOSIS — M6281 Muscle weakness (generalized): Secondary | ICD-10-CM | POA: Diagnosis present
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
## 2023-03-06 ENCOUNTER — Ambulatory Visit (HOSPITAL_COMMUNITY): Payer: Medicare PPO | Admitting: Physical Therapy

## 2023-03-14 ENCOUNTER — Ambulatory Visit (HOSPITAL_COMMUNITY): Payer: No Typology Code available for payment source | Attending: Physician Assistant | Admitting: Physical Therapy

## 2023-03-14 DIAGNOSIS — G459 Transient cerebral ischemic attack, unspecified: Secondary | ICD-10-CM | POA: Insufficient documentation

## 2023-03-14 DIAGNOSIS — M6281 Muscle weakness (generalized): Secondary | ICD-10-CM | POA: Insufficient documentation

## 2023-03-14 DIAGNOSIS — M545 Low back pain, unspecified: Secondary | ICD-10-CM | POA: Diagnosis not present
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
## 2023-03-21 ENCOUNTER — Encounter (HOSPITAL_COMMUNITY): Payer: Self-pay

## 2023-03-21 ENCOUNTER — Ambulatory Visit (HOSPITAL_COMMUNITY): Payer: No Typology Code available for payment source

## 2023-03-21 DIAGNOSIS — M545 Low back pain, unspecified: Secondary | ICD-10-CM

## 2023-03-21 DIAGNOSIS — G459 Transient cerebral ischemic attack, unspecified: Secondary | ICD-10-CM

## 2023-03-21 DIAGNOSIS — M6281 Muscle weakness (generalized): Secondary | ICD-10-CM
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
## 2023-03-23 ENCOUNTER — Encounter (HOSPITAL_COMMUNITY): Payer: Self-pay

## 2023-03-23 NOTE — Therapy (Signed)
[facility] Outpatient Rehabilitation at [facility] [address] [place], [place], [address] Phone: [phone]   Fax:  [phone]  Patient Details  Name: Marquan Vokes MRN: [id number] Date of Birth: May 19, 1946 Referring Provider:  No ref. provider found  Encounter Date: 03/23/2023 PHYSICAL THERAPY DISCHARGE SUMMARY  Visits from Start of Care: 3  Current functional level related to goals / functional outcomes: Goals met   Remaining deficits: Left UE pain   Education / Equipment: HEp   Patient agrees to discharge. Patient goals were met. Patient is being discharged due to meeting the stated rehab goals.   Marisue Brooklyn, PT 03/23/2023, 9:36 AM  [facility] Outpatient Rehabilitation at [facility] [address] [place], [place], [address] Phone: [phone]   Fax:  [phone]

## 2023-03-27 ENCOUNTER — Ambulatory Visit (HOSPITAL_COMMUNITY): Payer: Medicare PPO | Admitting: Physical Therapy

## 2023-04-03 ENCOUNTER — Ambulatory Visit (HOSPITAL_COMMUNITY): Payer: No Typology Code available for payment source

## 2024-04-22 ENCOUNTER — Other Ambulatory Visit (HOSPITAL_COMMUNITY): Payer: Self-pay | Admitting: Physician Assistant

## 2024-04-22 DIAGNOSIS — M25512 Pain in left shoulder: Secondary | ICD-10-CM

## 2024-04-28 ENCOUNTER — Ambulatory Visit (HOSPITAL_COMMUNITY)
Admission: RE | Admit: 2024-04-28 | Discharge: 2024-04-28 | Disposition: A | Discharge status: Home / Self Care | Source: Ambulatory Visit | Attending: Physician Assistant | Admitting: Physician Assistant

## 2024-04-28 DIAGNOSIS — M25512 Pain in left shoulder: Secondary | ICD-10-CM | POA: Insufficient documentation
